# Patient Record
Sex: Female | Born: 1965 | Race: White | Hispanic: No | Smoking: Current every day smoker
Health system: Southern US, Community
[De-identification: ages and names within clinical notes are randomized; demographics above are authoritative.]

## PROBLEM LIST (undated history)

## (undated) DIAGNOSIS — F32A Depression, unspecified: Secondary | ICD-10-CM

## (undated) DIAGNOSIS — F329 Major depressive disorder, single episode, unspecified: Secondary | ICD-10-CM

## (undated) HISTORY — DX: Depression, unspecified: F32.A

## (undated) HISTORY — PX: TUBAL LIGATION: SHX77

## (undated) HISTORY — DX: Major depressive disorder, single episode, unspecified: F32.9

---

## 2010-12-01 ENCOUNTER — Ambulatory Visit (INDEPENDENT_AMBULATORY_CARE_PROVIDER_SITE_OTHER): Payer: BC Managed Care – PPO | Admitting: Psychology

## 2010-12-01 DIAGNOSIS — F4323 Adjustment disorder with mixed anxiety and depressed mood: Secondary | ICD-10-CM

## 2010-12-08 ENCOUNTER — Encounter (INDEPENDENT_AMBULATORY_CARE_PROVIDER_SITE_OTHER): Payer: BC Managed Care – PPO | Admitting: Psychology

## 2010-12-08 DIAGNOSIS — F4321 Adjustment disorder with depressed mood: Secondary | ICD-10-CM

## 2010-12-21 ENCOUNTER — Encounter (HOSPITAL_COMMUNITY): Payer: BC Managed Care – PPO | Admitting: Behavioral Health

## 2010-12-21 ENCOUNTER — Encounter (HOSPITAL_COMMUNITY): Payer: BC Managed Care – PPO | Admitting: Psychology

## 2011-01-23 ENCOUNTER — Ambulatory Visit (INDEPENDENT_AMBULATORY_CARE_PROVIDER_SITE_OTHER): Payer: BC Managed Care – PPO | Admitting: Physician Assistant

## 2011-01-23 DIAGNOSIS — F339 Major depressive disorder, recurrent, unspecified: Secondary | ICD-10-CM

## 2011-02-02 ENCOUNTER — Encounter (INDEPENDENT_AMBULATORY_CARE_PROVIDER_SITE_OTHER): Payer: BC Managed Care – PPO | Admitting: Psychology

## 2011-02-02 DIAGNOSIS — F331 Major depressive disorder, recurrent, moderate: Secondary | ICD-10-CM

## 2011-02-13 ENCOUNTER — Encounter (HOSPITAL_COMMUNITY): Payer: BC Managed Care – PPO | Admitting: Psychology

## 2011-02-22 ENCOUNTER — Encounter (HOSPITAL_COMMUNITY): Payer: BC Managed Care – PPO | Admitting: Physician Assistant

## 2011-04-12 ENCOUNTER — Ambulatory Visit (HOSPITAL_COMMUNITY): Payer: Self-pay | Admitting: Psychiatry

## 2014-04-19 ENCOUNTER — Encounter: Payer: Self-pay | Admitting: Physician Assistant

## 2014-04-19 ENCOUNTER — Ambulatory Visit (INDEPENDENT_AMBULATORY_CARE_PROVIDER_SITE_OTHER): Payer: 59 | Admitting: Physician Assistant

## 2014-04-19 VITALS — BP 119/81 | HR 93 | Ht 63.0 in | Wt 176.0 lb

## 2014-04-19 DIAGNOSIS — Z131 Encounter for screening for diabetes mellitus: Secondary | ICD-10-CM

## 2014-04-19 DIAGNOSIS — Z23 Encounter for immunization: Secondary | ICD-10-CM | POA: Diagnosis not present

## 2014-04-19 DIAGNOSIS — E669 Obesity, unspecified: Secondary | ICD-10-CM | POA: Insufficient documentation

## 2014-04-19 DIAGNOSIS — F3289 Other specified depressive episodes: Secondary | ICD-10-CM | POA: Diagnosis not present

## 2014-04-19 DIAGNOSIS — E7150 Peroxisomal disorder, unspecified: Secondary | ICD-10-CM

## 2014-04-19 DIAGNOSIS — F329 Major depressive disorder, single episode, unspecified: Secondary | ICD-10-CM | POA: Diagnosis not present

## 2014-04-19 DIAGNOSIS — Z1239 Encounter for other screening for malignant neoplasm of breast: Secondary | ICD-10-CM

## 2014-04-19 DIAGNOSIS — R635 Abnormal weight gain: Secondary | ICD-10-CM | POA: Diagnosis not present

## 2014-04-19 DIAGNOSIS — F32A Depression, unspecified: Secondary | ICD-10-CM

## 2014-04-19 DIAGNOSIS — Z13220 Encounter for screening for lipoid disorders: Secondary | ICD-10-CM

## 2014-04-19 DIAGNOSIS — E71529 X-linked adrenoleukodystrophy, unspecified type: Secondary | ICD-10-CM | POA: Insufficient documentation

## 2014-04-19 MED ORDER — PHENTERMINE HCL 37.5 MG PO TABS: 37.5000 mg | ORAL_TABLET | Freq: Every day | ORAL | Status: DC

## 2014-04-19 MED ORDER — BUPROPION HCL 100 MG PO TABS: 100.0000 mg | ORAL_TABLET | Freq: Two times a day (BID) | ORAL | Status: DC

## 2014-04-19 NOTE — Progress Notes (Signed)
   Subjective:    Patient ID: Brittany Orozco, female    DOB: 05/16/1966, 48 y.o.   MRN: [id number]  HPI Pt is a 48 yo female who presents to the clinic to establish care.   .. Active Ambulatory Problems    Diagnosis Date Noted  . No Active Ambulatory Problems   Resolved Ambulatory Problems    Diagnosis Date Noted  . No Resolved Ambulatory Problems   Past Medical History  Diagnosis Date  . Depression    .Marland KitchenNo family history on file. .. History   Social History  . Marital Status: Divorced    Spouse Name: N/A    Number of Children: N/A  . Years of Education: N/A   Occupational History  . Not on file.   Social History Main Topics  . Smoking status: Current Every Day Smoker  . Smokeless tobacco: Not on file  . Alcohol Use: Yes  . Drug Use: No  . Sexual Activity: Not Currently   Other Topics Concern  . Not on file   Social History Narrative  . No narrative on file    Depression- wellcontrolled on wellbutrin.   Really wants to lose weight. Not currently exercising. Works from home. No diet in place.    Review of Systems  All other systems reviewed and are negative.      Objective:   Physical Exam  Constitutional: She is oriented to person, place, and time. She appears well-developed and well-nourished.  HENT:  Head: Normocephalic and atraumatic.  Cardiovascular: Normal rate, regular rhythm and normal heart sounds.   Pulmonary/Chest: Effort normal and breath sounds normal.  Neurological: She is alert and oriented to person, place, and time.  Skin: Skin is dry.  Psychiatric: She has a normal mood and affect. Her behavior is normal.          Assessment & Plan:  Depression- PHQ-9 was 4. Refilled wellbutrin for 1 year. Been controlled for a while.   Obesity/abnormal weight gain- started phentermine. Discussed diet and exercise with medication. diet keep to 1500 calories and try at least 150 minutes a week exercise.  Follow up in one month. Side effects  discussed.   Mammogram ordered for screening.   Flu shot given without complication.

## 2014-04-20 ENCOUNTER — Ambulatory Visit: Payer: 59

## 2014-04-23 ENCOUNTER — Other Ambulatory Visit: Payer: Self-pay | Admitting: *Deleted

## 2014-04-23 MED ORDER — PHENTERMINE HCL 37.5 MG PO TABS: 37.5000 mg | ORAL_TABLET | Freq: Every day | ORAL | Status: DC

## 2014-04-29 ENCOUNTER — Ambulatory Visit (INDEPENDENT_AMBULATORY_CARE_PROVIDER_SITE_OTHER): Payer: 59

## 2014-04-29 DIAGNOSIS — Z1239 Encounter for other screening for malignant neoplasm of breast: Secondary | ICD-10-CM

## 2014-04-29 DIAGNOSIS — Z1231 Encounter for screening mammogram for malignant neoplasm of breast: Secondary | ICD-10-CM

## 2014-05-07 ENCOUNTER — Other Ambulatory Visit: Payer: Self-pay | Admitting: Physician Assistant

## 2014-05-07 MED ORDER — BUPROPION HCL ER (SR) 150 MG PO TB12: 150.0000 mg | ORAL_TABLET | Freq: Two times a day (BID) | ORAL | Status: DC

## 2014-05-27 LAB — COMPLETE METABOLIC PANEL WITH GFR
ALBUMIN: 4.1 g/dL (ref 3.5–5.2)
ALT: 20 U/L (ref 0–35)
AST: 19 U/L (ref 0–37)
Alkaline Phosphatase: 41 U/L (ref 39–117)
BUN: 14 mg/dL (ref 6–23)
CALCIUM: 9 mg/dL (ref 8.4–10.5)
CHLORIDE: 103 meq/L (ref 96–112)
CO2: 26 mEq/L (ref 19–32)
Creat: 0.85 mg/dL (ref 0.50–1.10)
GFR, Est African American: >89 mL/min
GFR, Est Non African American: 82 mL/min
Glucose, Bld: 93 mg/dL (ref 70–99)
POTASSIUM: 4.4 meq/L (ref 3.5–5.3)
SODIUM: 140 meq/L (ref 135–145)
Total Bilirubin: 0.5 mg/dL (ref 0.2–1.2)
Total Protein: 6.7 g/dL (ref 6.0–8.3)

## 2014-05-27 LAB — LIPID PANEL
Cholesterol: 203 mg/dL — ABNORMAL HIGH (ref 0–200)
HDL: 44 mg/dL (ref >39)
LDL CALC: 132 mg/dL — AB (ref 0–99)
Total CHOL/HDL Ratio: 4.6 Ratio
Triglycerides: 133 mg/dL (ref <150)
VLDL: 27 mg/dL (ref 0–40)

## 2014-06-16 ENCOUNTER — Ambulatory Visit (INDEPENDENT_AMBULATORY_CARE_PROVIDER_SITE_OTHER): Payer: 59 | Admitting: Physician Assistant

## 2014-06-16 ENCOUNTER — Encounter: Payer: Self-pay | Admitting: Physician Assistant

## 2014-06-16 VITALS — BP 125/85 | HR 96 | Ht 63.0 in | Wt 169.0 lb

## 2014-06-16 DIAGNOSIS — F32A Depression, unspecified: Secondary | ICD-10-CM

## 2014-06-16 DIAGNOSIS — R635 Abnormal weight gain: Secondary | ICD-10-CM

## 2014-06-16 DIAGNOSIS — N938 Other specified abnormal uterine and vaginal bleeding: Secondary | ICD-10-CM

## 2014-06-16 DIAGNOSIS — F329 Major depressive disorder, single episode, unspecified: Secondary | ICD-10-CM

## 2014-06-16 MED ORDER — PHENTERMINE HCL 37.5 MG PO TABS: 37.5000 mg | ORAL_TABLET | Freq: Every day | ORAL | Status: DC

## 2014-06-16 NOTE — Progress Notes (Signed)
   Subjective:    Patient ID: Brittany Orozco, female    DOB: 17-Jan-1966, 48 y.o.   MRN: [id number]  HPI  Pt presents to the clinic to follow up on weight loss and starting phentermine. Down 7lbs. Admits to changing diet but not currently exercising. Denies any side effects. No insomnia, palpations, dizziness. Only taking 1/2 tablet of phentermine daily.   Depression- needs refill. Doing well on wellbutrin.   Pt has had 2-3 episode of longer periods for last 6 months. At the longest has been 3 weeks. She does go some months without any periods. No abdominal pain.       Review of Systems  All other systems reviewed and are negative.      Objective:   Physical Exam  Constitutional: She is oriented to person, place, and time. She appears well-developed and well-nourished.  HENT:  Head: Normocephalic and atraumatic.  Cardiovascular: Normal rate, regular rhythm and normal heart sounds.   Pulmonary/Chest: Effort normal and breath sounds normal. She has no wheezes.  Neurological: She is alert and oriented to person, place, and time.  Skin: Skin is dry.  Psychiatric: She has a normal mood and affect. Her behavior is normal.          Assessment & Plan:  Abnormal weight gain/obesity- continue at 1/2 tablet daily. Follow up in 2 months nurse visit. Discussed adding in exercise with diet changes.   Depression- pt will call to confirm 150mg  dose. Ok to refill for 6 months when find out dose.   DUB- will order pelvic ultrasound. Need referral to GYN. Pt also need pap. Pt may need to consider ablation.

## 2014-06-16 NOTE — Patient Instructions (Signed)
Dysfunctional Uterine Bleeding Normally, menstrual periods begin between ages 11 to 17 in young women. A normal menstrual cycle/period may begin every 23 days up to 35 days and lasts from 1 to 7 days. Around 12 to 14 days before your menstrual period starts, ovulation (ovary produces an egg) occurs. When counting the time between menstrual periods, count from the first day of bleeding of the previous period to the first day of bleeding of the next period. Dysfunctional (abnormal) uterine bleeding is bleeding that is different from a normal menstrual period. Your periods may come earlier or later than usual. They may be lighter, have blood clots or be heavier. You may have bleeding between periods, or you may skip one period or more. You may have bleeding after sexual intercourse, bleeding after menopause, or no menstrual period. CAUSES   Pregnancy (normal, miscarriage, tubal).  IUDs (intrauterine device, birth control).  Birth control pills.  Hormone treatment.  Menopause.  Infection of the cervix.  Blood clotting problems.  Infection of the inside lining of the uterus.  Endometriosis, inside lining of the uterus growing in the pelvis and other female organs.  Adhesions (scar tissue) inside the uterus.  Obesity or severe weight loss.  Uterine polyps inside the uterus.  Cancer of the vagina, cervix, or uterus.  Ovarian cysts or polycystic ovary syndrome.  Medical problems (diabetes, thyroid disease).  Uterine fibroids (noncancerous tumor).  Problems with your female hormones.  Endometrial hyperplasia, very thick lining and enlarged cells inside of the uterus.  Medicines that interfere with ovulation.  Radiation to the pelvis or abdomen.  Chemotherapy. DIAGNOSIS   Your doctor will discuss the history of your menstrual periods, medicines you are taking, changes in your weight, stress in your life, and any medical problems you may have.  Your doctor will do a physical  and pelvic examination.  Your doctor may want to perform certain tests to make a diagnosis, such as:  Pap test.  Blood tests.  Cultures for infection.  CT scan.  Ultrasound.  Hysteroscopy.  Laparoscopy.  MRI.  Hysterosalpingography.  D and C.  Endometrial biopsy. TREATMENT  Treatment will depend on the cause of the dysfunctional uterine bleeding (DUB). Treatment may include:  Observing your menstrual periods for a couple of months.  Prescribing medicines for medical problems, including:  Antibiotics.  Hormones.  Birth control pills.  Removing an IUD (intrauterine device, birth control).  Surgery:  D and C (scrape and remove tissue from inside the uterus).  Laparoscopy (examine inside the abdomen with a lighted tube).  Uterine ablation (destroy lining of the uterus with electrical current, laser, heat, or freezing).  Hysteroscopy (examine cervix and uterus with a lighted tube).  Hysterectomy (remove the uterus). HOME CARE INSTRUCTIONS   If medicines were prescribed, take exactly as directed. Do not change or switch medicines without consulting your caregiver.  Long term heavy bleeding may result in iron deficiency. Your caregiver may have prescribed iron pills. They help replace the iron that your body lost from heavy bleeding. Take exactly as directed.  Do not take aspirin or medicines that contain aspirin one week before or during your menstrual period. Aspirin may make the bleeding worse.  If you need to change your sanitary pad or tampon more than once every 2 hours, stay in bed with your feet elevated and a cold pack on your lower abdomen. Rest as much as possible, until the bleeding stops or slows down.  Eat well-balanced meals. Eat foods high in iron. Examples   are:  Leafy green vegetables.  Whole-grain breads and cereals.  Eggs.  Meat.  Liver.  Do not try to lose weight until the abnormal bleeding has stopped and your blood iron level is  back to normal. Do not lift more than ten pounds or do strenuous activities when you are bleeding.  For a couple of months, make note on your calendar, marking the start and ending of your period, and the type of bleeding (light, medium, heavy, spotting, clots or missed periods). This is for your caregiver to better evaluate your problem. SEEK MEDICAL CARE IF:   You develop nausea (feeling sick to your stomach) and vomiting, dizziness, or diarrhea while you are taking your medicine.  You are getting lightheaded or weak.  You have any problems that may be related to the medicine you are taking.  You develop pain with your DUB.  You want to remove your IUD.  You want to stop or change your birth control pills or hormones.  You have any type of abnormal bleeding mentioned above.  You are over 16 years old and have not had a menstrual period yet.  You are 48 years old and you are still having menstrual periods.  You have any of the symptoms mentioned above.  You develop a rash. SEEK IMMEDIATE MEDICAL CARE IF:   An oral temperature above 102 F (38.9 C) develops.  You develop chills.  You are changing your sanitary pad or tampon more than once an hour.  You develop abdominal pain.  You pass out or faint. Document Released: 07/20/2000 Document Revised: 10/15/2011 Document Reviewed: 06/21/2009 Exit[employer] Patient Information 2015 [employer]. This information is not intended to replace advice given to you by your health care provider. Make sure you discuss any questions you have with your health care provider.  

## 2014-06-21 ENCOUNTER — Other Ambulatory Visit: Payer: Self-pay | Admitting: Physician Assistant

## 2014-06-21 DIAGNOSIS — N938 Other specified abnormal uterine and vaginal bleeding: Secondary | ICD-10-CM

## 2014-06-22 ENCOUNTER — Ambulatory Visit (INDEPENDENT_AMBULATORY_CARE_PROVIDER_SITE_OTHER): Payer: 59

## 2014-06-22 DIAGNOSIS — R938 Abnormal findings on diagnostic imaging of other specified body structures: Secondary | ICD-10-CM

## 2014-06-22 DIAGNOSIS — N938 Other specified abnormal uterine and vaginal bleeding: Secondary | ICD-10-CM

## 2014-06-23 ENCOUNTER — Encounter: Payer: Self-pay | Admitting: Physician Assistant

## 2014-06-23 DIAGNOSIS — N938 Other specified abnormal uterine and vaginal bleeding: Secondary | ICD-10-CM | POA: Insufficient documentation

## 2014-07-05 ENCOUNTER — Ambulatory Visit (INDEPENDENT_AMBULATORY_CARE_PROVIDER_SITE_OTHER): Payer: 59 | Admitting: Obstetrics & Gynecology

## 2014-07-05 ENCOUNTER — Encounter: Payer: Self-pay | Admitting: Obstetrics & Gynecology

## 2014-07-05 VITALS — BP 139/81 | HR 79 | Resp 16 | Ht 63.0 in | Wt 167.0 lb

## 2014-07-05 DIAGNOSIS — N938 Other specified abnormal uterine and vaginal bleeding: Secondary | ICD-10-CM

## 2014-07-05 DIAGNOSIS — N924 Excessive bleeding in the premenopausal period: Secondary | ICD-10-CM

## 2014-07-05 LAB — CBC
HCT: 41.6 % (ref 36.0–46.0)
Hemoglobin: 13.9 g/dL (ref 12.0–15.0)
MCH: 31.1 pg (ref 26.0–34.0)
MCHC: 33.4 g/dL (ref 30.0–36.0)
MCV: 93.1 fL (ref 78.0–100.0)
MPV: 10.6 fL (ref 9.4–12.4)
PLATELETS: 395 10*3/uL (ref 150–400)
RBC: 4.47 MIL/uL (ref 3.87–5.11)
RDW: 13 % (ref 11.5–15.5)
WBC: 11.3 10*3/uL — ABNORMAL HIGH (ref 4.0–10.5)

## 2014-07-05 MED ORDER — MEDROXYPROGESTERONE ACETATE 10 MG PO TABS
10.0000 mg | ORAL_TABLET | Freq: Every day | ORAL | Status: AC
Start: 2014-07-05 — End: ?

## 2014-07-05 NOTE — Patient Instructions (Signed)
Place abnormal uterine bleeding patient instructions here.

## 2014-07-05 NOTE — Progress Notes (Signed)
  Subjective:     Brittany Orozco is an 48 y.o. woman who presents for irregular menses. She had been bleeding daily for 6-7 weeks and just stopped.  She has had nml lihgt regular menses for most of her life until the past 3 months.   Three years ago she did have a few irregular cycles but it got better on its own without intervention. Cyclic symptoms include: none. Current contraception: tubal ligation. History of infertility: no. History of abnormal Pap smear: no.  Menstrual History: OB History    Gravida Para Term Preterm AB TAB SAB Ectopic Multiple Living   4 3 3       2       Patient's last menstrual period was 04/06/2014.    The following portions of the patient's history were reviewed and updated as appropriate: allergies, current medications, past family history, past medical history, past social history, past surgical history and problem list.  Review of Systems Pertinent items are noted in HPI.    Objective:    BP 139/81 mmHg  Pulse 79  Resp 16  Ht 5\' 3"  (1.6 m)  Wt 167 lb (75.751 kg)  BMI 29.59 kg/m2  LMP 04/06/2014  General:   alert, cooperative and no distress  Skin:    normal  Neck:  supple, symmetrical, trachea midline and thyroid not enlarged, symmetric, no tenderness/mass/nodules  Abdomen:  soft, non-tender; bowel sounds normal; no masses,  no organomegaly  Pelvic:   cervix normal in appearance, external genitalia normal, no adnexal masses or tenderness, no cervical motion tenderness, uterus normal size, shape, and consistency and vagina normal without discharge     Assessment:      menometrorrhagia, thickened endometrium on US (16mm)    Plan:   ENDOMETRIAL BIOPSY     The indications for endometrial biopsy were reviewed.   Risks of the biopsy including cramping, bleeding, infection, uterine perforation, inadequate specimen and need for additional procedures  were discussed. The patient states she understands and agrees to undergo procedure today. Consent was  signed. Time out was performed. Urine HCG was negative. A sterile speculum was placed in the patient's vagina and the cervix was prepped with Betadine. A single-toothed tenaculum was placed on the anterior lip of the cervix to stabilize it. The 3 mm pipelle was introduced into the endometrial cavity without difficulty to a depth of 9.5 cm, and a moderate amount of tissue was obtained and sent to pathology. The instruments were removed from the patient's vagina. Minimal bleeding from the cervix was noted. The patient tolerated the procedure well. Routine post-procedure instructions were given to the patient. The patient will follow up to review the results and for further management.    Provera x2 weeks.  TSH, CBC  RTC in one month

## 2014-07-05 NOTE — Addendum Note (Signed)
Addended by: Granville LewisLARK, Kimberlyn Quiocho L on: 07/05/2014 01:03 PM   Modules accepted: Orders

## 2014-07-06 ENCOUNTER — Telehealth: Payer: Self-pay | Admitting: *Deleted

## 2014-07-06 LAB — TSH: TSH: 0.864 u[IU]/mL (ref 0.350–4.500)

## 2014-07-06 NOTE — Telephone Encounter (Signed)
Called pt to adv labs are nrml. Still waiting on path report. Pt expressed understanding.

## 2014-07-06 NOTE — Telephone Encounter (Signed)
-----   Message from Lesly DukesKelly H Leggett, MD sent at 07/06/2014  7:02 AM EST ----- Call the patient and let them know their lab results are normal.  Thanks!!

## 2014-07-07 ENCOUNTER — Telehealth: Payer: Self-pay | Admitting: *Deleted

## 2014-07-07 NOTE — Telephone Encounter (Signed)
Pt notified of neg biopsy and continue the Provera as prescribed.

## 2014-07-07 NOTE — Telephone Encounter (Signed)
-----   Message from Lesly DukesKelly H Leggett, MD sent at 07/07/2014 12:53 AM EST ----- Call the patient and her know that biopsy is benign and should continue the provera as directed.  Thanks!!

## 2014-08-03 ENCOUNTER — Ambulatory Visit (INDEPENDENT_AMBULATORY_CARE_PROVIDER_SITE_OTHER): Payer: 59 | Admitting: Obstetrics & Gynecology

## 2014-08-03 ENCOUNTER — Encounter: Payer: Self-pay | Admitting: Obstetrics & Gynecology

## 2014-08-03 VITALS — BP 114/81 | HR 90 | Resp 16 | Ht 63.0 in | Wt 166.0 lb

## 2014-08-03 DIAGNOSIS — N924 Excessive bleeding in the premenopausal period: Secondary | ICD-10-CM

## 2014-08-03 NOTE — Progress Notes (Signed)
Pt presents after endometrial biopsy and course of provera.  Pt had a little bleeding when starting provera but none recently.  Pt to stop provera and see if she continues to bleed.  Explained that it is normal to have bleeding the first 10 days after stopping provera.  If continues, then will proceed with hysteroscopy/ablation or a sonohysterography.    Nml Tsh  Diagnosis Endometrium, biopsy - BENIGN DISORDERED PROLIFERATIVE PATTERN. Microscopic Comment Sections show endometrium which displays a disordered proliferative pattern characterized by a variation in the shape and density of endometrial glands from area to area. No significant glandular crowding is present. There is no cytologic atypia and there is no evidence of malignancy. Guerry BruinBASSAM SMIR MD Pathologist, Electronic Signature (Case signed 07/06/2014)   Measurements: 7.5 x 4.2 x 4.8 cm. No fibroids or other mass visualized.  Endometrium  Thickness: 16 mm. Thickened/hypervascular.  Right ovary  Measurements: 3.0 x 2.3 x 2.2 cm. 1.7 x 1.3 x 1.3 cm simple cyst/follicle.  Left ovary  Not discretely visualized.  Other findings  No free fluid.  IMPRESSION: Endometrium is thickened/hypervascular, measuring 16 mm.  Left ovary is not discretely visualized.  If bleeding remains unresponsive to hormonal or medical therapy, focal lesion work-up with sonohysterogram should be considered. Endometrial biopsy should also be considered in pre-menopausal patients at high risk for endometrial carcinoma.  (Ref: Radiological Reasoning: Algorithmic Workup of Abnormal Vaginal Bleeding with Endovaginal Sonography and Sonohysterography. AJR 2008; 161:[id number]:[id number])  10 minutes spent fact to face with patient and >50% counseling.

## 2014-08-16 ENCOUNTER — Encounter: Payer: Self-pay | Admitting: Physician Assistant

## 2014-08-16 ENCOUNTER — Ambulatory Visit (INDEPENDENT_AMBULATORY_CARE_PROVIDER_SITE_OTHER): Payer: 59 | Admitting: Physician Assistant

## 2014-08-16 VITALS — BP 119/74 | HR 94 | Ht 63.0 in | Wt 161.0 lb

## 2014-08-16 DIAGNOSIS — R635 Abnormal weight gain: Secondary | ICD-10-CM

## 2014-08-16 DIAGNOSIS — E663 Overweight: Secondary | ICD-10-CM

## 2014-08-16 MED ORDER — PHENTERMINE HCL 37.5 MG PO TABS: 37.5000 mg | ORAL_TABLET | Freq: Every day | ORAL | Status: AC

## 2014-08-16 NOTE — Progress Notes (Signed)
   Subjective:    Patient ID: Brittany Orozco, female    DOB: Sep 06, 1965, 49 y.o.   MRN: [id number]  HPI Pt presents to the clinic to follow up on weight loss. Down 6 more pounds from November 2015. Doing great. She is walking for exercise. She has cut calories and feeling great. No side effects of nausea, insomnia, dry mouth. She is taking 1/2 tablet daily.    Review of Systems  All other systems reviewed and are negative.      Objective:   Physical Exam  Constitutional: She is oriented to person, place, and time. She appears well-developed and well-nourished.  HENT:  Head: Normocephalic and atraumatic.  Cardiovascular: Normal rate, regular rhythm and normal heart sounds.   Pulmonary/Chest: Effort normal and breath sounds normal.  Neurological: She is alert and oriented to person, place, and time.  Skin: Skin is dry.  Psychiatric: She has a normal mood and affect. Her behavior is normal.          Assessment & Plan:  Abnormal weight gain/overweight- BMI down to 28. Doing great. Vitals great. Continue on 1/2 tablet daily. Phentermine refilled. Follow up in 2 months.

## 2015-09-25 IMAGING — US US PELVIS COMPLETE
1 series · 13 of 25 positions shown · non-contrast
Comparison: None

CLINICAL DATA: Dysfunctional uterine bleeding, heavy vaginal
bleeding with clots x6 weeks

EXAM:
TRANSABDOMINAL AND TRANSVAGINAL ULTRASOUND OF PELVIS
TECHNIQUE: Both transabdominal and transvaginal ultrasound examinations of the
pelvis were performed. Transabdominal technique was performed for
global imaging of the pelvis including uterus, ovaries, adnexal
regions, and pelvic cul-de-sac. It was necessary to proceed with
endovaginal exam following the transabdominal exam to visualize the
endometrium and left adnexa.

[Series 1: us pelvis complete · 0.20mm/px · 13 of 101 slices shown]
[im 1/101]
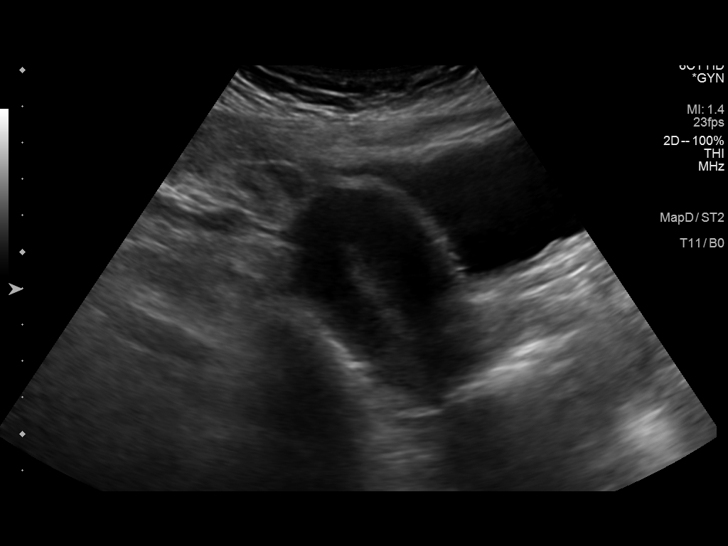
[im 9/101]
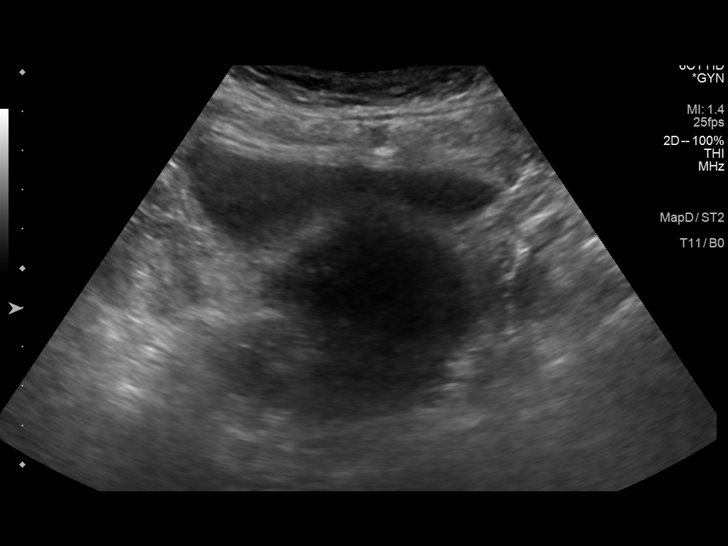
[im 17/101]
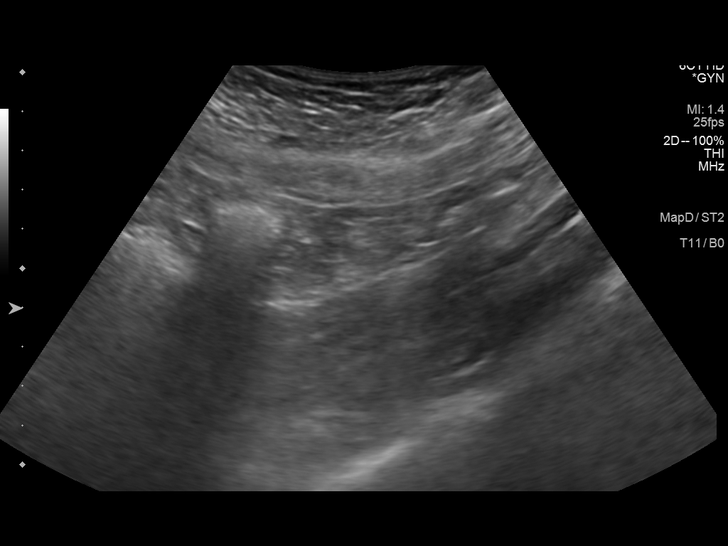
[im 26/101]
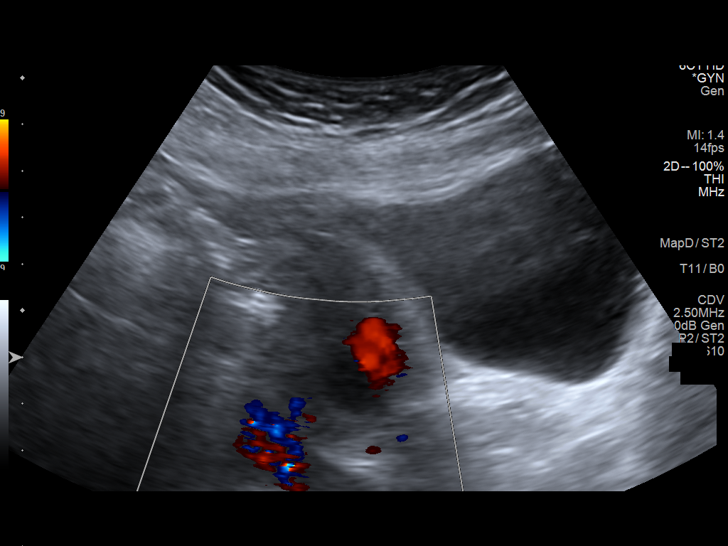
[im 34/101]
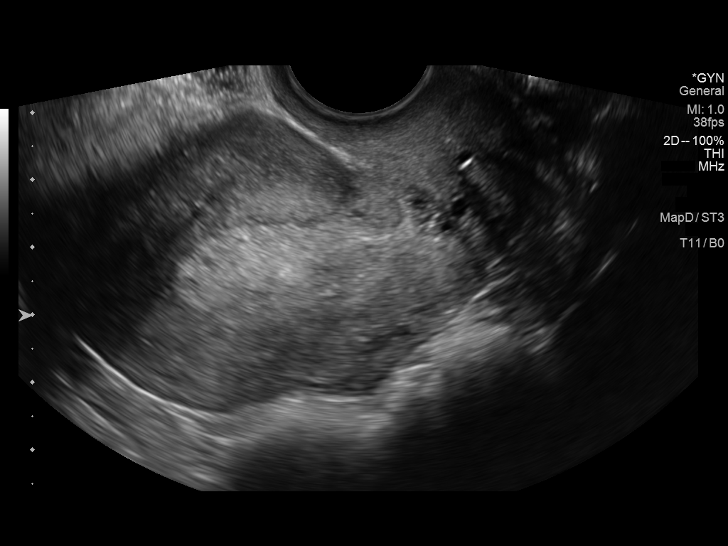
[im 42/101]
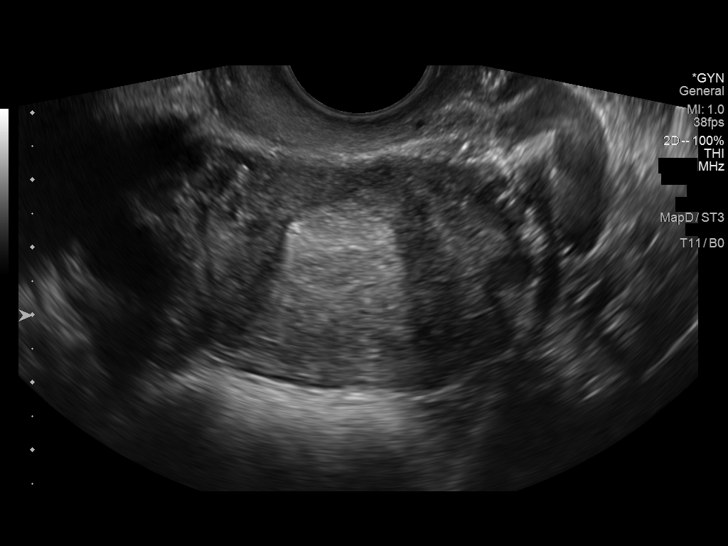
[im 51/101]
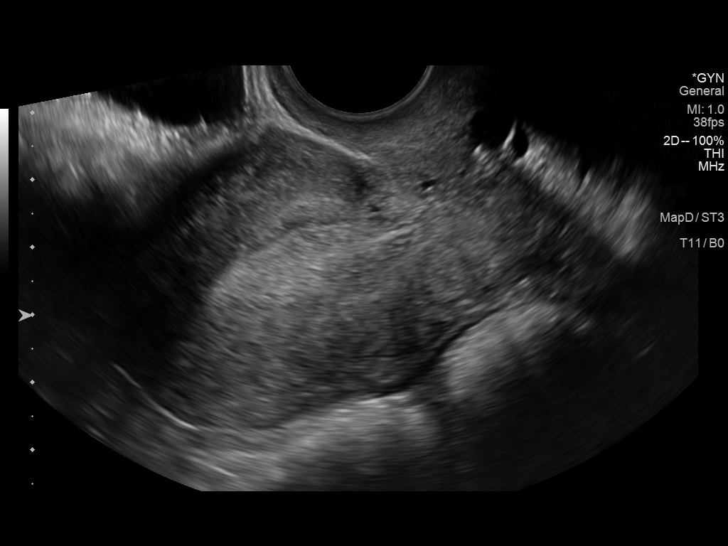
[im 59/101]
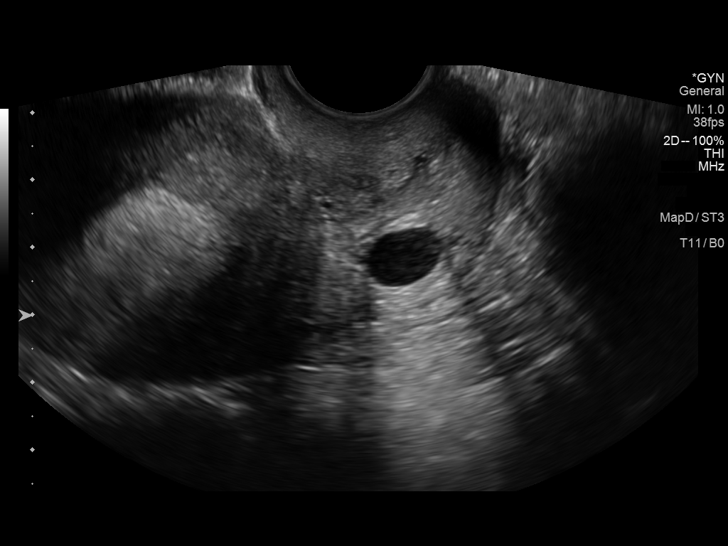
[im 67/101]
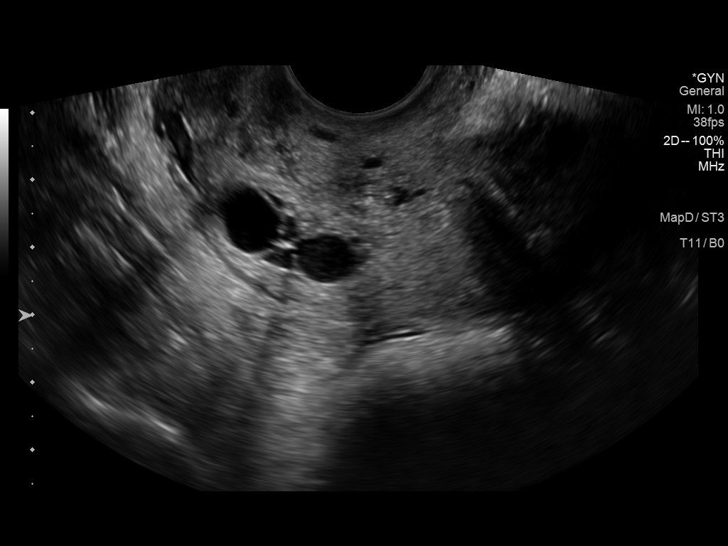
[im 76/101]
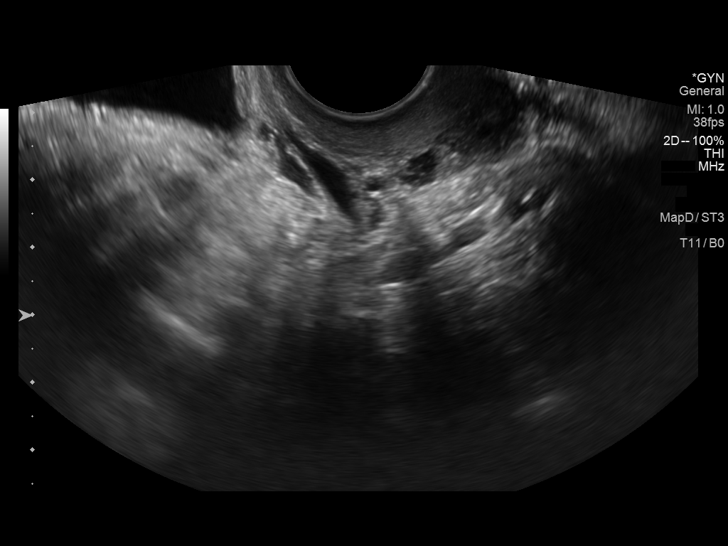
[im 84/101]
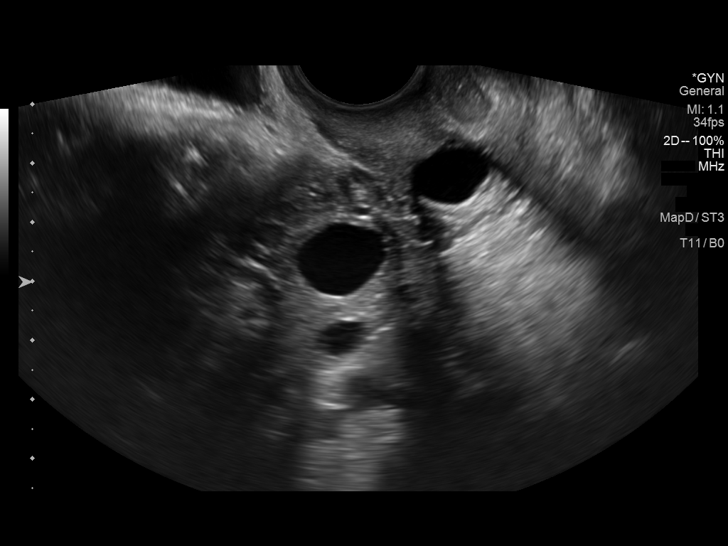
[im 92/101]
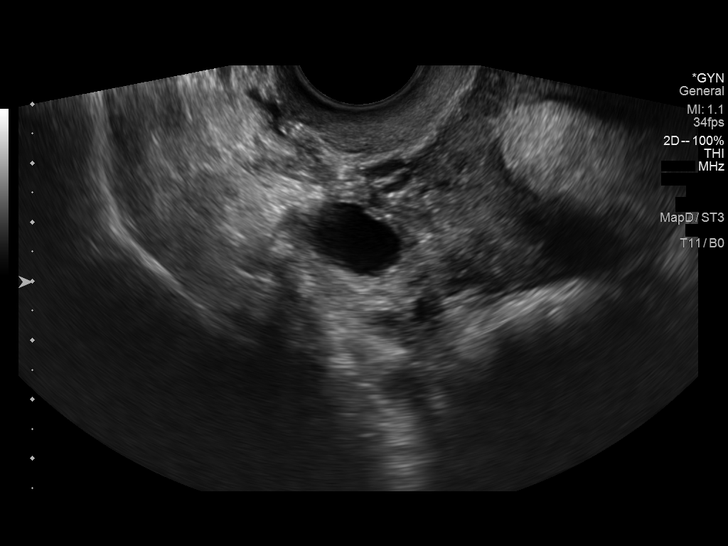
[im 101/101]
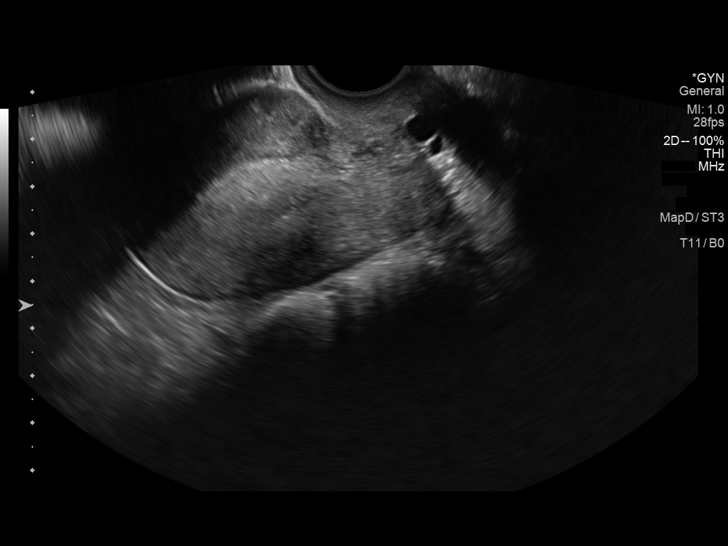

[13 of 25 positions shown; findings below may reference images not displayed]

FINDINGS: Uterus

Measurements: 7.5 x 4.2 x 4.8 cm. No fibroids or other mass
visualized.

Endometrium

Thickness: 16 mm.  Thickened/hypervascular.

Right ovary

Measurements: 3.0 x 2.3 x 2.2 cm. 1.7 x 1.3 x 1.3 cm simple
cyst/follicle.

Left ovary

Not discretely visualized.

Other findings

No free fluid.
IMPRESSION: Endometrium is thickened/hypervascular, measuring 16 mm.

Left ovary is not discretely visualized.

If bleeding remains unresponsive to hormonal or medical therapy,
focal lesion work-up with sonohysterogram should be considered.
Endometrial biopsy should also be considered in pre-menopausal
patients at high risk for endometrial carcinoma.

(Ref: Radiological Reasoning: Algorithmic Workup of Abnormal Vaginal
Bleeding with Endovaginal Sonography and Sonohysterography. AJR
2337; 191:S68-73)
